# Patient Record
Sex: Female | Born: 1940 | Race: White | Hispanic: No
Health system: Southern US, Community
[De-identification: ages and names within clinical notes are randomized; demographics above are authoritative.]

---

## 2009-10-14 ENCOUNTER — Ambulatory Visit: Payer: Self-pay | Admitting: Internal Medicine

## 2011-07-07 ENCOUNTER — Inpatient Hospital Stay: Payer: Self-pay | Admitting: Internal Medicine

## 2011-07-07 LAB — CK TOTAL AND CKMB (NOT AT ARMC)
CK, Total: 94 U/L (ref 21–215)
CK-MB: 1 ng/mL (ref 0.5–3.6)

## 2011-07-07 LAB — URINALYSIS, COMPLETE
Blood: NEGATIVE
Glucose,UR: NEGATIVE mg/dL (ref 0–75)
Nitrite: NEGATIVE
Ph: 7 (ref 4.5–8.0)
RBC,UR: NONE SEEN /HPF (ref 0–5)
Specific Gravity: 1.004 (ref 1.003–1.030)
Squamous Epithelial: 1
WBC UR: 12 /HPF (ref 0–5)

## 2011-07-07 LAB — BASIC METABOLIC PANEL
Calcium, Total: 9.1 mg/dL (ref 8.5–10.1)
Chloride: 90 mmol/L — ABNORMAL LOW (ref 98–107)
Co2: 27 mmol/L (ref 21–32)
EGFR (Non-African Amer.): >60
Potassium: 3.6 mmol/L (ref 3.5–5.1)
Sodium: 125 mmol/L — ABNORMAL LOW (ref 136–145)

## 2011-07-07 LAB — SODIUM, URINE, RANDOM: Sodium, Urine Random: 28 mmol/L (ref 20–110)

## 2011-07-07 LAB — COMPREHENSIVE METABOLIC PANEL
BUN: 8 mg/dL (ref 7–18)
Chloride: 83 mmol/L — ABNORMAL LOW (ref 98–107)
Co2: 28 mmol/L (ref 21–32)
Creatinine: 0.91 mg/dL (ref 0.60–1.30)
Potassium: 2.5 mmol/L — CL (ref 3.5–5.1)
SGOT(AST): 27 U/L (ref 15–37)
Sodium: 121 mmol/L — ABNORMAL LOW (ref 136–145)
Total Protein: 7.8 g/dL (ref 6.4–8.2)

## 2011-07-07 LAB — CBC
MCHC: 35.1 g/dL (ref 32.0–36.0)
MCV: 90 fL (ref 80–100)
Platelet: 329 10*3/uL (ref 150–440)
RBC: 4.06 10*6/uL (ref 3.80–5.20)
RDW: 13.9 % (ref 11.5–14.5)
WBC: 5.7 10*3/uL (ref 3.6–11.0)

## 2011-07-07 LAB — TROPONIN I: Troponin-I: <0.02 ng/mL

## 2011-07-07 LAB — VALPROIC ACID LEVEL: Valproic Acid: 47 ug/mL — ABNORMAL LOW

## 2011-07-07 LAB — RAPID INFLUENZA A&B ANTIGENS

## 2011-07-08 LAB — CBC WITH DIFFERENTIAL/PLATELET
Basophil #: 0 10*3/uL (ref 0.0–0.1)
Basophil %: 0.5 %
Eosinophil #: 0 10*3/uL (ref 0.0–0.7)
Eosinophil %: 0.4 %
HCT: 33.2 % — ABNORMAL LOW (ref 35.0–47.0)
HGB: 11.6 g/dL — ABNORMAL LOW (ref 12.0–16.0)
Lymphocyte #: 1.4 10*3/uL (ref 1.0–3.6)
Lymphocyte %: 35.2 %
MCH: 31.7 pg (ref 26.0–34.0)
Monocyte #: 0.6 10*3/uL (ref 0.0–0.7)
Monocyte %: 16.1 %
Neutrophil #: 1.8 10*3/uL (ref 1.4–6.5)
Neutrophil %: 47.8 %
RBC: 3.64 10*6/uL — ABNORMAL LOW (ref 3.80–5.20)
WBC: 3.9 10*3/uL (ref 3.6–11.0)

## 2011-07-08 LAB — BASIC METABOLIC PANEL
Anion Gap: 12 (ref 7–16)
BUN: 8 mg/dL (ref 7–18)
Co2: 23 mmol/L (ref 21–32)
EGFR (African American): >60
Glucose: 81 mg/dL (ref 65–99)
Osmolality: 258 (ref 275–301)
Sodium: 130 mmol/L — ABNORMAL LOW (ref 136–145)

## 2011-07-09 LAB — BASIC METABOLIC PANEL
Anion Gap: 11 (ref 7–16)
Calcium, Total: 8.3 mg/dL — ABNORMAL LOW (ref 8.5–10.1)
Co2: 22 mmol/L (ref 21–32)
Creatinine: 0.76 mg/dL (ref 0.60–1.30)
EGFR (African American): >60
Glucose: 88 mg/dL (ref 65–99)
Osmolality: 269 (ref 275–301)
Sodium: 136 mmol/L (ref 136–145)

## 2011-07-09 LAB — CBC WITH DIFFERENTIAL/PLATELET
Basophil #: 0 10*3/uL (ref 0.0–0.1)
Basophil %: 0.4 %
Eosinophil #: 0 10*3/uL (ref 0.0–0.7)
Eosinophil %: 0.3 %
HGB: 11.1 g/dL — ABNORMAL LOW (ref 12.0–16.0)
Lymphocyte #: 1.7 10*3/uL (ref 1.0–3.6)
MCH: 32 pg (ref 26.0–34.0)
MCHC: 35.3 g/dL (ref 32.0–36.0)
MCV: 91 fL (ref 80–100)
Monocyte #: 0.4 10*3/uL (ref 0.0–0.7)
Neutrophil #: 1.8 10*3/uL (ref 1.4–6.5)
Neutrophil %: 45.3 %
RBC: 3.46 10*6/uL — ABNORMAL LOW (ref 3.80–5.20)
RDW: 14.6 % — ABNORMAL HIGH (ref 11.5–14.5)

## 2011-07-09 LAB — URINE CULTURE

## 2011-07-12 LAB — CULTURE, BLOOD (SINGLE)

## 2012-06-26 IMAGING — CT CT HEAD WITHOUT CONTRAST
1 of 2 series · 13 of 30 positions shown, 17 images · non-contrast
Comparison: none

REASON FOR EXAM: confusion
COMMENTS:

[Series 2: without · axial · non-contrast · 0.41mm/px · z∈[+1162,+1282]mm · 13 of 29 slices shown, 17 images]
[im 3/29  brain]
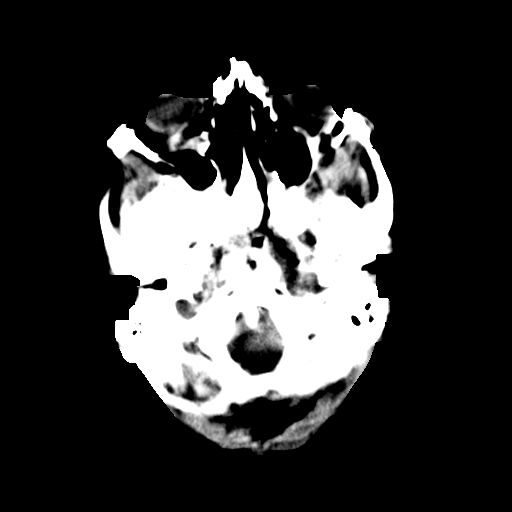
[im 3/29  bone]
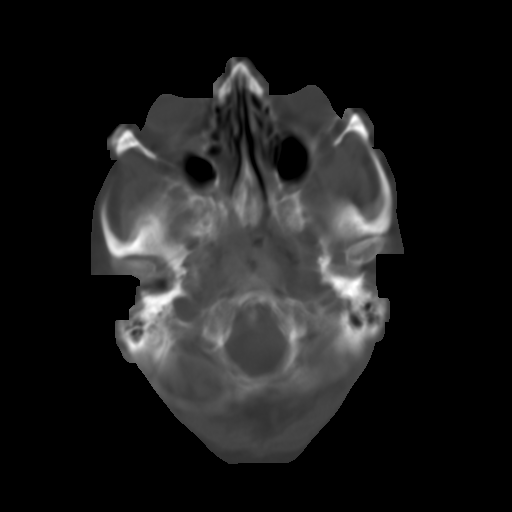
[im 5/29  brain]
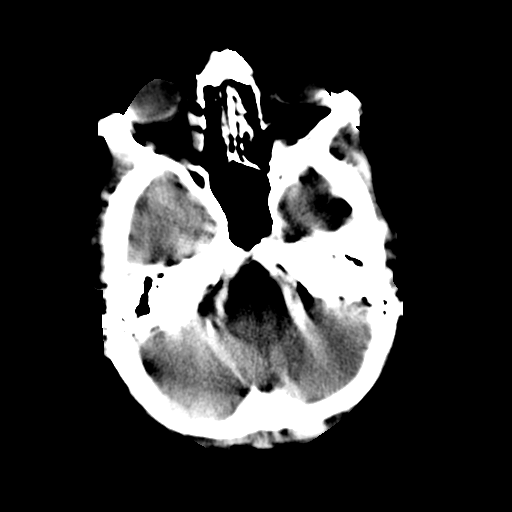
[im 7/29  brain]
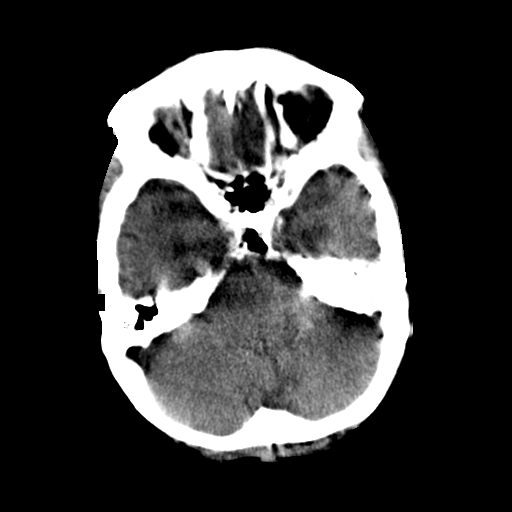
[im 9/29  brain]
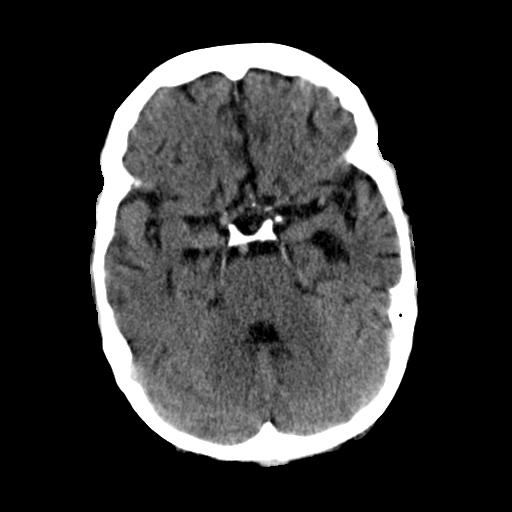
[im 11/29  brain]
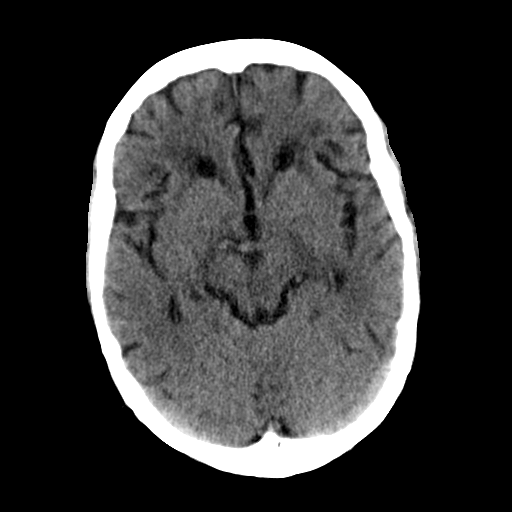
[im 11/29  bone]
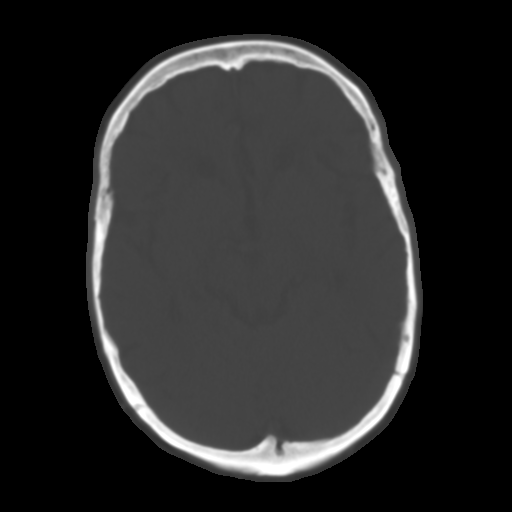
[im 13/29  brain]
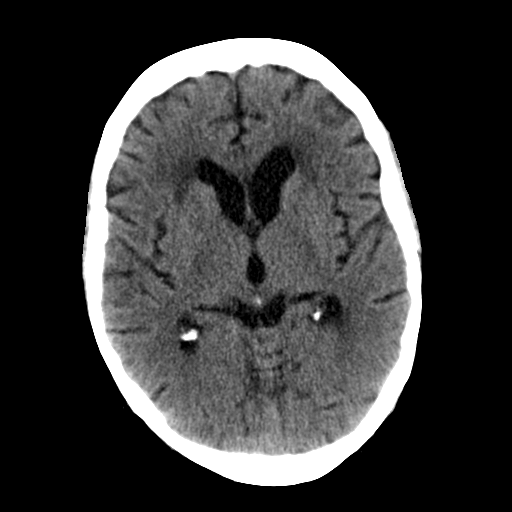
[im 15/29  brain]
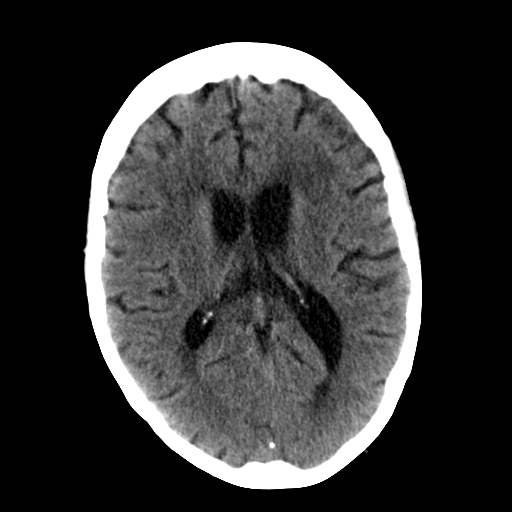
[im 17/29  brain]
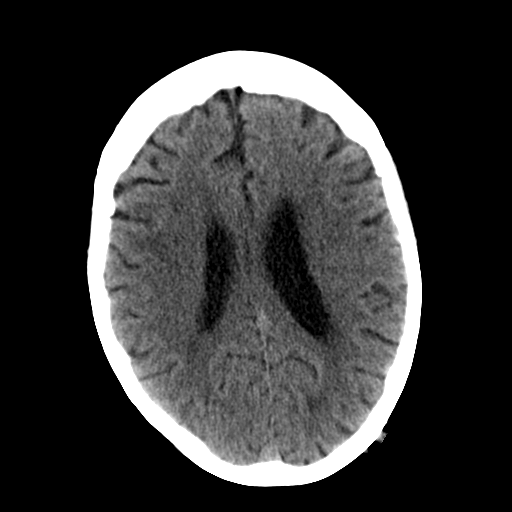
[im 19/29  brain]
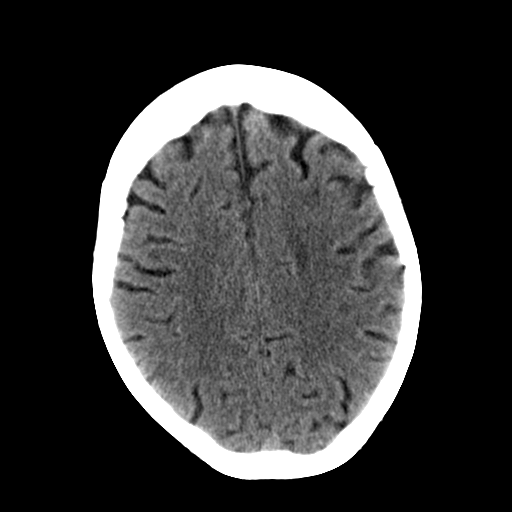
[im 19/29  bone]
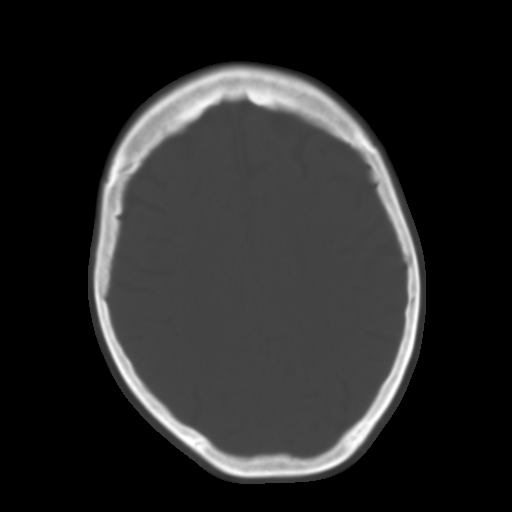
[im 21/29  brain]
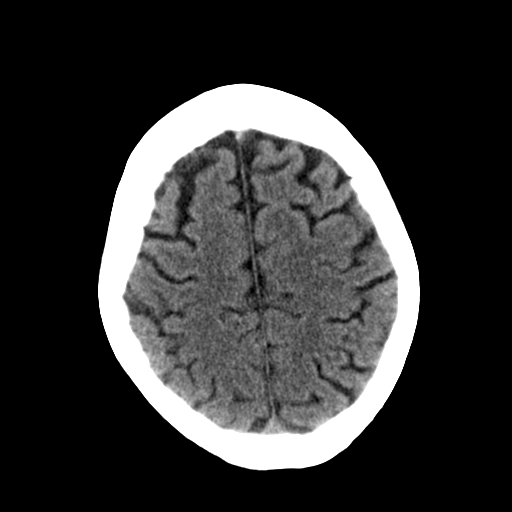
[im 23/29  brain]
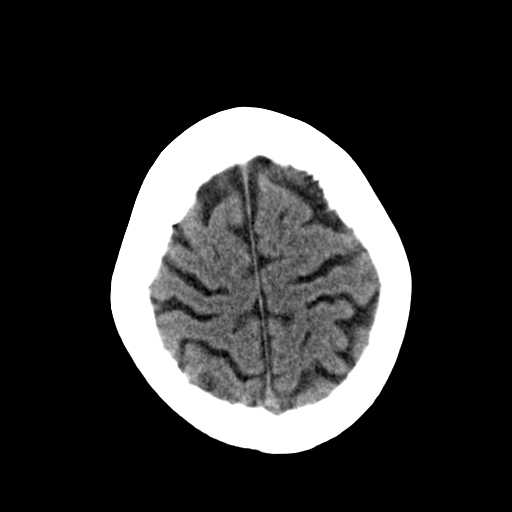
[im 25/29  brain]
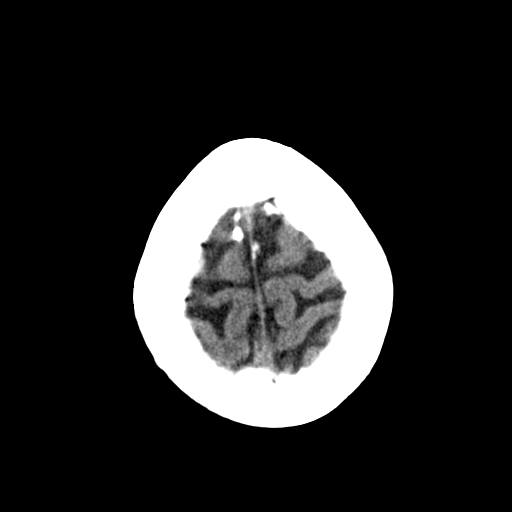
[im 27/29  brain]
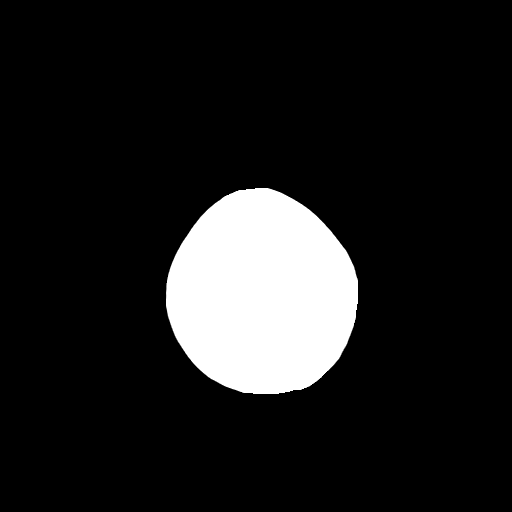
[im 27/29  bone]
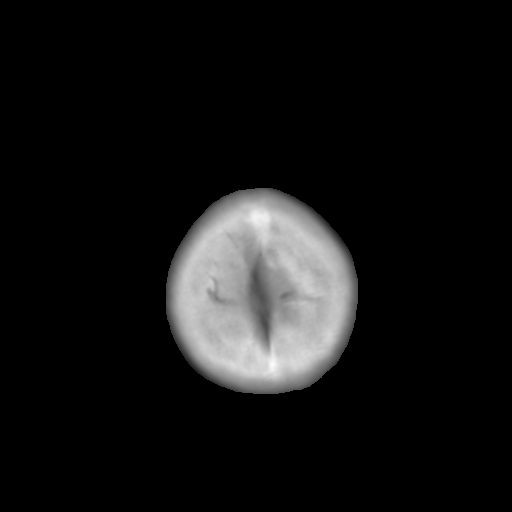

[13 of 30 positions shown; findings below may reference images not displayed]

PROCEDURE:     CT  - CT HEAD WITHOUT CONTRAST  - July 07, 2011 [DATE]

RESULT:     Axial noncontrast CT scanning was performed through the brain
with reconstructions at 5 mm intervals and slice thicknesses.

There is mild diffuse cerebral and cerebellar atrophy with compensatory
ventriculomegaly. There is no shift of the midline. There is no acute
intracranial hemorrhage. There is no evidence of an evolving ischemic
infarction. There is mildly decreased density in the deep white matter of
both cerebral hemispheres consistent with chronic small vessel ischemic type
change. The calvarium exhibits no evidence of an acute fracture. The
observed portions of the paranasal sinuses are clear.
IMPRESSION: 1. I see no evidence of an acute ischemic or hemorrhagic infarction.
2. There is no intracranial mass effect.
3. There is decreased density in the deep white matter of both cerebral
hemispheres consistent with chronic small vessel ischemic type change.

## 2013-08-25 DEATH — deceased

## 2014-10-19 NOTE — Discharge Summary (Signed)
PATIENT NAME:  Kari Adams, [name] MR#:  [id number] DATE OF BIRTH:  09/24/1940  DATE OF ADMISSION:  07/07/2011 DATE OF DISCHARGE:  07/12/2011  DISCHARGE DIAGNOSES:  1. Hyponatremia, likely due to dehydration and use of hydrochlorothiazide.  2. Hypokalemia. 3. Klebsiella urinary tract infection.  4. Weakness due to urinary tract infection. 5. Dehydration.  6. Hypertension.  7. Hypothyroidism.  8. Anxiety.  9. Depression.  10. Dementia.  11. Paranoia.   12. History of breast cancer, status post lumpectomy and radiation several years ago.   DISPOSITION: The patient is being discharged to a rehab facility.   DIET: Low sodium.   ACTIVITY: As tolerated.   DISCHARGE MEDICATIONS:  1. Tylenol 650 mg every 6 hours p.r.n.  2. Zofran ODT 4 mg every 6 hours p.r.n.  3. Nicotine patch 21 mg daily for 2 weeks.  4. Levaquin 500 mg daily for 3 days. 5. Haldol 2 mg t.i.d.  6. Ativan 1 mg t.i.d. p.r.n. agitation.  7. Namenda 10 mg b.i.d.  8. Aricept 10 mg daily.  9. Synthroid 50 mcg daily.  10. Seroquel 300 mg in the evening. 11. Depakote 500 mg daily.  CONSULTATION: Psychiatry consultation with Dr. Guss [place].   CODE STATUS. The patient is a DO NOT RESUSCITATE.   LABORATORY, DIAGNOSTIC AND RADIOLOGICAL DATA:  Urine culture showed Klebsiella urinary tract infection which is sensitive to Levaquin.  CAT scan of the head showed no acute intracranial abnormalities.  Chest x-ray showed no acute changes.  CBC showed normal white count, normal platelet count, hemoglobin ranging from 12.9 to 11.1.  Sodium 121 on admission and 136 by the time of discharge. Potassium 2.5 on admission and 3.7 by the time of discharge.  Low magnesium, which was supplemented.  Normal TSH normal.  Normal LFTs.   HOSPITAL COURSE: The patient is a 74 year old female with past medical history of breast cancer, status post radiation and lumpectomy, hypertension, anxiety, depression, paranoia, who was brought in by her family for poor  intake and weakness. She was found to have multiple electrolyte abnormalities, including low potassium, magnesium and sodium which were felt to be due to poor oral intake, dehydration, and use of HCTZ. The patient was treated with IV fluids. Her electrolytes were replaced, and they have all normalized by the time of discharge. The patient was also found to have klebsiella urinary tract infection for which she is on treatment with Levaquin. A TSH level was checked and was normal. The patient will continue taking her current dose of Synthroid. The patient had anxiety, depression and paranoia as well as dementia. A Psychiatry consultation was obtained, and the patient was started on Depakote, Seroquel, Aricept and Namenda. For her continued weakness, the patient was evaluated by Physical Therapy, who recommended rehab. The patient is being discharged to a rehab facility in a stable condition.   TIME SPENT: 45 minutes. ____________________________ Darrick MeigsSangeeta Halona Amstutz, MD sp:cbb D: 07/12/2011 12:14:47 ET T: 07/12/2011 12:26:45 ET JOB#: [id number]  cc: Darrick MeigsSangeeta Mace Weinberg, MD, <Dictator> Darrick MeigsSANGEETA Natoshia Souter MD ELECTRONICALLY SIGNED 07/13/2011 16:26

## 2014-10-19 NOTE — H&P (Signed)
PATIENT NAME:  Kari Adams, Rekisha MR#:  [id number] DATE OF BIRTH:  28-May-1941  DATE OF ADMISSION:  07/07/2011  REFERRING PHYSICIAN: Glennie IsleSheryl Gottlieb, MD  PRIMARY CARE PHYSICIAN: Bari EdwardLaura Berglund, MD (The patient has not seen yet and has an appointment on Monday)   CHIEF COMPLAINT: Weakness, failure to thrive, poor intake.   HISTORY OF PRESENT ILLNESS: The patient is a [age] year old Caucasian female with history of hypertension, progressive dementia, anxiety, depression, and paranoia who was brought in by family after the patient had decreased p.o. intake and weakness which has been progressive for what appears to be weeks to months, and increased lethargy. The patient is a very poor historian and appears unreliable and the information was derived from the patient's daughter who is in the room. Apparently the patient has progressed in terms of her dementia for months with acutely worsening in the last month or so. The patient has been having poor p.o. intake and has 1 to 2 bites and then stops beating. The patient has no weight loss, however. The patient has been having increased restlessness. There is no fevers, nausea, vomiting, or diarrhea, however there is some dysuria. There is a cough with whitish sputum production. On arrival the patient was found to have a serum sodium of 121 and potassium of 2.5 with serum osmolality calculated at 242 and was referred to the hospitalist service for evaluation and admission. On arrival also the patient appears to have a urinary tract infection, at least based on a urinalysis, with 2+ leukocyte esterase, 12 WBCs, and 3+ bacteria. The patient was given some IV fluids and some potassium by the ED staff.   PAST MEDICAL HISTORY:  1. Progressive dementia, as above. 2. Hypertension. 3. Hypothyroidism. 4. Anxiety. 5. Depression. 6. Paranoia. 7. Breast cancer status post lumpectomy and radiation multiple years ago.   ALLERGIES: Codeine.   MEDICATIONS:   1. Hydrochlorothiazide 25 mg daily. 2. Levothyroxine 0.05 mg daily. 3. Donepezil 5 mg daily. 4. Divalproex sodium 500 mg extended-release daily.  5. Metoprolol 25 mg extended-release daily.  6. Quetiapine 300 mg extended-release daily.   PAST SURGICAL HISTORY:  1. Hysterectomy.  2. Breast cancer with lumpectomy.   FAMILY HISTORY: Multiple family members with cancer. Brother with myocardial infarction. Mother with myocardial infarction. Brother with severe Alzheimer's.   SOCIAL HISTORY: The patient lives with her nephew. She smokes 1 pack a day of tobacco. No alcohol or drug use.  REVIEW OF SYSTEMS: CONSTITUTIONAL: There is no fever. There is fatigue and weakness. No weight changes. EYES: No blurry vision or double vision. ENT: The patient complains of left ear pain. No drainage or discharge. No sinus pain. RESPIRATORY: The patient has a cough with mild whitish sputum. No wheezing or hemoptysis. The patient endorses dyspnea. No asthma or painful respiration. No history of chronic obstructive pulmonary disease. CARDIOVASCULAR: No chest pains or orthopnea. No edema, arrhythmias, or palpitations. GI: No nausea, vomiting, diarrhea, abdominal pain, hematemesis, or rectal bleeding, per the patient and daughter. GENITOURINARY: The patient has dysuria. No hematuria. No frequency or incontinence. ENDOCRINE: No polyuria or nocturia. The patient has hypothyroidism. HEME/LYMPH: No anemia or swollen glands. SKIN: No rashes. MUSCULOSKELETAL: Denies gout and redness. NEUROLOGIC: Weakness that is more generalized. No tremor or vertigo. The patient has history of dementia. No cerebrovascular accident or transient ischemic attack. PSYCH: Anxiety, insomnia, and depression with paranoia.   PHYSICAL EXAMINATION:   VITAL SIGNS: On arrival temperature was 98.6, pulse 67, respiratory rate 20, blood pressure 117/65, and oxygen saturation 98%.  GENERAL: The patient is a Caucasian female lying in bed, in no obvious  distress. Flat affect.   HEENT: Normocephalic, atraumatic. Pupils are equal and reactive. Poor dentition. Dry mucous membranes. On examination of the ears, there is significant amount of earwax in both ears. The tympanic membranes are not observable.  NECK: No JVD. Neck is supple. No thyroid tenderness.   HEART: S1 and S2 regular rate and rhythm. No murmurs, rubs, or gallops.   LUNGS: Clear to auscultation without wheezing, rales, or rhonchi.   ABDOMEN: Soft, nontender, and nondistended. No hepatomegaly noted.   EXTREMITIES: No significant edema. Skin turgor is poor.   NEURO: Cranial nerves II through XII are grossly intact. Strength 5/5 in all extremities.  Sensation intact to light touch.   PSYCH: Awake, alert, and oriented to place and daughter. Affect flat.   LABS/STUDIES: Sodium 121, potassium 2.5, chloride 85, creatinine 0.91, BUN 8, and glucose 94. LFTs within normal limits. Troponin negative. CK-MB 1. WBC 5.7, hemoglobin 12.9, hematocrit 36.7, and platelets 329. Influenza negative.   Urinalysis: No blood and no nitrites. There is 2+ leukocyte esterase, 12 WBCs, and 3 bacteria.   CT of the head without contrast: No evidence of acute ischemia or hemorrhagic infarction. There is no intracranial mass affect. There is decreased density in the deep white matter of both cerebral hemispheres consistent with chronic small vessel ischemic type changes.   ASSESSMENT AND PLAN: We have a 74 year old Caucasian female with progressive dementia, hypertension, hypothyroidism, anxiety, depression, and paranoia who presents with what appears to be more of a subacute to chronic weakness, failure to thrive, with recently poor p.o. intake who complains of dysuria and was found with significant electrolyte abnormalities and probably urinary tract infection. At this point, we will admit the patient to the hospital and start IV fluid resuscitation with normal saline. The hyponatremia and hypokalemia likely is  because of poor p.o. intake and the fact that the patient is on hydrochlorothiazide. We would hold that for now and check urine electrolytes and calculate FENa. Would encourage p.o. intake. The patient appears to be hypovolemic on exam and has low plasma osmolality consistent with hypertonic hyponatremia, hypovolemic in nature. Would also resume potassium supplementation with IV fluids and p.o. I would check a TSH and resume the Synthroid as well. The patient appears to have some symptoms of a urinary tract infection and has lab with urinalysis suggestive of a urinary tract infection. We would start Levaquin which would cover the urine as well as a possible ear infection on the left that she has been complaining of, although on examination the patient has extensive wax in both ears. The patient has no fever or leukocytosis, however. Here rapid flu has been negative and would just continue the Levaquin for now. In regards to her psych medications, we would check a Divalproex level and continue the medications. We would check an echocardiogram and BNP as well as the patient has been having shortness of breath that is progressive and appears to be chronic in nature. I do not know if there is an element of congestive heart failure at this point. We would consult with physical therapy and case management in case the patient needs rehab. I would start the patient on Lovenox for deep vein thrombosis prophylaxis.   CODE STATUS: FULL CODE.   TOTAL TIME SPENT: 60 minutes. ____________________________ Krystal Eaton, MD sa:slb D: 07/07/2011 14:02:29 ET T: 07/07/2011 15:30:32 ET JOB#: [id number]  cc: Krystal Eaton, MD, <Dictator> Bari Edward, MD  Krystal Eaton MD ELECTRONICALLY SIGNED 07/08/2011 20:38
